# Patient Record
Sex: Female | Born: 1995 | Race: White | Hispanic: No | Marital: Single | Smoking: Never smoker
Health system: Southern US, Community
[De-identification: ages and names within clinical notes are randomized; demographics above are authoritative.]

## PROBLEM LIST (undated history)

## (undated) DIAGNOSIS — E079 Disorder of thyroid, unspecified: Secondary | ICD-10-CM

---

## 2020-02-18 ENCOUNTER — Emergency Department
Admission: EM | Admit: 2020-02-18 | Discharge: 2020-02-18 | Disposition: A | Discharge status: Home / Self Care | Payer: BC Managed Care – PPO | Attending: Emergency Medicine | Admitting: Emergency Medicine

## 2020-02-18 ENCOUNTER — Emergency Department: Payer: BC Managed Care – PPO

## 2020-02-18 ENCOUNTER — Other Ambulatory Visit: Payer: Self-pay

## 2020-02-18 DIAGNOSIS — Z79899 Other long term (current) drug therapy: Secondary | ICD-10-CM | POA: Insufficient documentation

## 2020-02-18 DIAGNOSIS — E079 Disorder of thyroid, unspecified: Secondary | ICD-10-CM | POA: Diagnosis not present

## 2020-02-18 DIAGNOSIS — M79604 Pain in right leg: Secondary | ICD-10-CM | POA: Insufficient documentation

## 2020-02-18 DIAGNOSIS — R52 Pain, unspecified: Secondary | ICD-10-CM

## 2020-02-18 DIAGNOSIS — M79605 Pain in left leg: Secondary | ICD-10-CM | POA: Diagnosis not present

## 2020-02-18 DIAGNOSIS — E860 Dehydration: Secondary | ICD-10-CM | POA: Diagnosis not present

## 2020-02-18 DIAGNOSIS — M7918 Myalgia, other site: Secondary | ICD-10-CM | POA: Insufficient documentation

## 2020-02-18 DIAGNOSIS — Z20822 Contact with and (suspected) exposure to covid-19: Secondary | ICD-10-CM | POA: Insufficient documentation

## 2020-02-18 HISTORY — DX: Disorder of thyroid, unspecified: E07.9

## 2020-02-18 LAB — URINALYSIS, COMPLETE (UACMP) WITH MICROSCOPIC
Bilirubin Urine: NEGATIVE
Glucose, UA: NEGATIVE mg/dL
Hgb urine dipstick: NEGATIVE
Ketones, ur: NEGATIVE mg/dL
Leukocytes,Ua: NEGATIVE
Nitrite: NEGATIVE
Protein, ur: 30 mg/dL — AB
Specific Gravity, Urine: 1.019 (ref 1.005–1.030)
pH: 9 — ABNORMAL HIGH (ref 5.0–8.0)

## 2020-02-18 LAB — CBC WITH DIFFERENTIAL/PLATELET
Abs Immature Granulocytes: 0.01 10*3/uL (ref 0.00–0.07)
Basophils Absolute: 0 10*3/uL (ref 0.0–0.1)
Basophils Relative: 0 %
Eosinophils Absolute: 0 10*3/uL (ref 0.0–0.5)
Eosinophils Relative: 0 %
HCT: 44.2 % (ref 36.0–46.0)
Hemoglobin: 15.6 g/dL — ABNORMAL HIGH (ref 12.0–15.0)
Immature Granulocytes: 0 %
Lymphocytes Relative: 7 %
Lymphs Abs: 0.2 10*3/uL — ABNORMAL LOW (ref 0.7–4.0)
MCH: 31 pg (ref 26.0–34.0)
MCHC: 35.3 g/dL (ref 30.0–36.0)
MCV: 87.7 fL (ref 80.0–100.0)
Monocytes Absolute: 0.6 10*3/uL (ref 0.1–1.0)
Monocytes Relative: 18 %
Neutro Abs: 2.4 10*3/uL (ref 1.7–7.7)
Neutrophils Relative %: 75 %
Platelets: 133 10*3/uL — ABNORMAL LOW (ref 150–400)
RBC: 5.04 MIL/uL (ref 3.87–5.11)
RDW: 11.6 % (ref 11.5–15.5)
WBC: 3.2 10*3/uL — ABNORMAL LOW (ref 4.0–10.5)
nRBC: 0 % (ref 0.0–0.2)

## 2020-02-18 LAB — COMPREHENSIVE METABOLIC PANEL
ALT: 15 U/L (ref 0–44)
AST: 26 U/L (ref 15–41)
Albumin: 4.7 g/dL (ref 3.5–5.0)
Alkaline Phosphatase: 52 U/L (ref 38–126)
Anion gap: 11 (ref 5–15)
BUN: 11 mg/dL (ref 6–20)
CO2: 25 mmol/L (ref 22–32)
Calcium: 10.1 mg/dL (ref 8.9–10.3)
Chloride: 101 mmol/L (ref 98–111)
Creatinine, Ser: 0.83 mg/dL (ref 0.44–1.00)
GFR calc Af Amer: >60 mL/min (ref >60)
GFR calc non Af Amer: >60 mL/min (ref >60)
Glucose, Bld: 110 mg/dL — ABNORMAL HIGH (ref 70–99)
Potassium: 3.8 mmol/L (ref 3.5–5.1)
Sodium: 137 mmol/L (ref 135–145)
Total Bilirubin: 1.4 mg/dL — ABNORMAL HIGH (ref 0.3–1.2)
Total Protein: 7.2 g/dL (ref 6.5–8.1)

## 2020-02-18 LAB — POC SARS CORONAVIRUS 2 AG: SARS Coronavirus 2 Ag: NEGATIVE

## 2020-02-18 LAB — POCT PREGNANCY, URINE: Preg Test, Ur: NEGATIVE

## 2020-02-18 LAB — PROTIME-INR
INR: 1 (ref 0.8–1.2)
Prothrombin Time: 12.7 seconds (ref 11.4–15.2)

## 2020-02-18 LAB — CK: Total CK: 72 U/L (ref 38–234)

## 2020-02-18 LAB — INFLUENZA PANEL BY PCR (TYPE A & B)
Influenza A By PCR: NEGATIVE
Influenza B By PCR: NEGATIVE

## 2020-02-18 LAB — TSH: TSH: 1.986 u[IU]/mL (ref 0.350–4.500)

## 2020-02-18 LAB — LACTIC ACID, PLASMA
Lactic Acid, Venous: 3.1 mmol/L (ref 0.5–1.9)
Lactic Acid, Venous: 1.5 mmol/L (ref 0.5–1.9)

## 2020-02-18 LAB — SARS CORONAVIRUS 2 BY RT PCR (HOSPITAL ORDER, PERFORMED IN ~~LOC~~ HOSPITAL LAB): SARS Coronavirus 2: NEGATIVE

## 2020-02-18 LAB — T4, FREE: Free T4: 1.29 ng/dL — ABNORMAL HIGH (ref 0.61–1.12)

## 2020-02-18 MED ORDER — ONDANSETRON HCL 4 MG PO TABS: 4.0000 mg | ORAL_TABLET | Freq: Every day | ORAL | 0 refills | Status: AC | PRN

## 2020-02-18 MED ORDER — LACTATED RINGERS IV BOLUS
1000.0000 mL | Freq: Once | INTRAVENOUS | Status: AC
  Administered 2020-02-18: 1000 mL via INTRAVENOUS

## 2020-02-18 MED ORDER — HYDROCODONE-ACETAMINOPHEN 5-325 MG PO TABS
1.0000 | ORAL_TABLET | Freq: Once | ORAL | Status: AC
  Administered 2020-02-18: 1 via ORAL
  Filled 2020-02-18: qty 1

## 2020-02-18 MED ORDER — DOXYCYCLINE HYCLATE 100 MG PO TABS
100.0000 mg | ORAL_TABLET | Freq: Once | ORAL | Status: AC
  Administered 2020-02-18: 100 mg via ORAL
  Filled 2020-02-18: qty 1

## 2020-02-18 MED ORDER — ACETAMINOPHEN 325 MG PO TABS
650.0000 mg | ORAL_TABLET | Freq: Once | ORAL | Status: AC
  Administered 2020-02-18: 650 mg via ORAL
  Filled 2020-02-18: qty 2

## 2020-02-18 MED ORDER — DOXYCYCLINE HYCLATE 100 MG PO TABS
100.0000 mg | ORAL_TABLET | Freq: Two times a day (BID) | ORAL | 0 refills | Status: AC
Start: 2020-02-18 — End: 2020-02-25

## 2020-02-18 NOTE — ED Notes (Signed)
Pt states that she is already feeling better, half the bag of IV fluids have infused.  Will allow the rest of the IV fluids to infuse before drawing the second LA

## 2020-02-18 NOTE — ED Notes (Addendum)
At 1900 I received a Call from mother who advises that pt has pain again.  Notified her that she can come back to ED.  She states that pt feels like she "can't do that again".  I advised that she may also do a e-visit through [employer].

## 2020-02-18 NOTE — ED Triage Notes (Signed)
Pt c/o waking yesterday with body aches, states today worse and more in the BL LE, pt is figity in triage due to discomfort. Denies N/V/D/ or fever.

## 2020-02-18 NOTE — ED Provider Notes (Signed)
[facility] Emergency Department Provider Note    First MD Initiated Contact with Patient 02/18/20 1119     (approximate)  I have reviewed the triage vital signs and the nursing notes.   HISTORY  Chief Complaint Generalized Body Aches    HPI MONET NORTH is a 24 y.o. female with a history of Hashimoto's presents to the ER for evaluation of muscle aches primarily in the bilateral lower extremities and generalized malaise for the past 24 hours.  She works as a Advertising account executive and has been around many sick contacts.  Has not received her Covid vaccination.  [place] does not [place] pulling off any ticks but is frequently shooting film in fields and mother states that she recently had several ticks on her.  Did not actually have any measured fevers.  No headache congestion cough or nausea or vomiting.  No dysuria.  No abdominal pain.  No rashes.    Past Medical History:  Diagnosis Date  . Thyroid disease    No family history on file. History reviewed. No pertinent surgical history. There are no problems to display for this patient.     Prior to Admission medications   Medication Sig Start Date End Date Taking? Authorizing Provider  levothyroxine (SYNTHROID) 50 MCG tablet Take 50 mcg by mouth daily. 01/15/20  Yes [provider]  sertraline (ZOLOFT) 100 MG tablet Take 100 mg by mouth daily. 12/31/19  Yes [provider]  doxycycline (VIBRA-TABS) 100 MG tablet Take 1 tablet (100 mg total) by mouth 2 (two) times daily for 7 days. 02/18/20 02/25/20  Willy Eddy, MD  ondansetron (ZOFRAN) 4 MG tablet Take 1 tablet (4 mg total) by mouth daily as needed. 02/18/20 02/17/21  Willy Eddy, MD    Allergies Bevelyn Buckles (malabar nut tree) [justicia adhatoda], Aspirin, Ibuprofen, and Latex    Social History Social History   Tobacco Use  . Smoking status: Never Smoker  . Smokeless tobacco: Never Used   Substance Use Topics  . Alcohol use: Not Currently  . Drug use: Not Currently    Review of Systems Patient denies headaches, rhinorrhea, blurry vision, numbness, shortness of breath, chest pain, edema, cough, abdominal pain, nausea, vomiting, diarrhea, dysuria, fevers, rashes or hallucinations unless otherwise stated above in HPI. ____________________________________________   PHYSICAL EXAM:  VITAL SIGNS: Vitals:   02/18/20 1300 02/18/20 1406  BP: 108/69 109/68  Pulse: 91 88  Resp:    Temp:    SpO2: 98% 100%    Constitutional: Alert and oriented.  Eyes: Conjunctivae are normal.  Head: Atraumatic. Nose: No congestion/rhinnorhea. Mouth/Throat: Mucous membranes are moist.   Neck: No stridor. Painless ROM.  Cardiovascular: Normal rate, regular rhythm. Grossly normal heart sounds.  Good peripheral circulation. Respiratory: Normal respiratory effort.  No retractions. Lungs CTAB. Gastrointestinal: Soft and nontender. No distention. No abdominal bruits. No CVA tenderness. Genitourinary:  Musculoskeletal: No lower extremity tenderness nor edema.  No joint effusions. Neurologic:  Normal speech and language. No gross focal neurologic deficits are appreciated. No facial droop Skin:  Skin is warm, dry and intact. No rash noted. Psychiatric: Mood and affect are normal. Speech and behavior are normal.  ____________________________________________   LABS (all labs ordered are listed, but only abnormal results are displayed)  Results for orders placed or performed during the hospital encounter of 02/18/20 (from the past 24 hour(s))  Comprehensive metabolic panel     Status: Abnormal   Collection Time: 02/18/20 10:45  AM  Result Value Ref Range   Sodium 137 135 - 145 mmol/L   Potassium 3.8 3.5 - 5.1 mmol/L   Chloride 101 98 - 111 mmol/L   CO2 25 22 - 32 mmol/L   Glucose, Bld 110 (H) 70 - 99 mg/dL   BUN 11 6 - 20 mg/dL   Creatinine, Ser 9.62 0.44 - 1.00 mg/dL   Calcium 83.6 8.9 -  62.9 mg/dL   Total Protein 7.2 6.5 - 8.1 g/dL   Albumin 4.7 3.5 - 5.0 g/dL   AST 26 15 - 41 U/L   ALT 15 0 - 44 U/L   Alkaline Phosphatase 52 38 - 126 U/L   Total Bilirubin 1.4 (H) 0.3 - 1.2 mg/dL   GFR calc non Af Amer >60 >60 mL/min   GFR calc Af Amer >60 >60 mL/min   Anion gap 11 5 - 15  Lactic acid, plasma     Status: Abnormal   Collection Time: 02/18/20 10:45 AM  Result Value Ref Range   Lactic Acid, Venous 3.1 (HH) 0.5 - 1.9 mmol/L  CBC with Differential     Status: Abnormal   Collection Time: 02/18/20 10:45 AM  Result Value Ref Range   WBC 3.2 (L) 4.0 - 10.5 K/uL   RBC 5.04 3.87 - 5.11 MIL/uL   Hemoglobin 15.6 (H) 12.0 - 15.0 g/dL   HCT 47.6 36 - 46 %   MCV 87.7 80.0 - 100.0 fL   MCH 31.0 26.0 - 34.0 pg   MCHC 35.3 30.0 - 36.0 g/dL   RDW 54.6 50.3 - 54.6 %   Platelets 133 (L) 150 - 400 K/uL   nRBC 0.0 0.0 - 0.2 %   Neutrophils Relative % 75 %   Neutro Abs 2.4 1.7 - 7.7 K/uL   Lymphocytes Relative 7 %   Lymphs Abs 0.2 (L) 0.7 - 4.0 K/uL   Monocytes Relative 18 %   Monocytes Absolute 0.6 0 - 1 K/uL   Eosinophils Relative 0 %   Eosinophils Absolute 0.0 0 - 0 K/uL   Basophils Relative 0 %   Basophils Absolute 0.0 0 - 0 K/uL   Immature Granulocytes 0 %   Abs Immature Granulocytes 0.01 0.00 - 0.07 K/uL  Protime-INR     Status: None   Collection Time: 02/18/20 10:45 AM  Result Value Ref Range   Prothrombin Time 12.7 11.4 - 15.2 seconds   INR 1.0 0.8 - 1.2  Urinalysis, Complete w Microscopic     Status: Abnormal   Collection Time: 02/18/20 10:45 AM  Result Value Ref Range   Color, Urine YELLOW (A) YELLOW   APPearance HAZY (A) CLEAR   Specific Gravity, Urine 1.019 1.005 - 1.030   pH 9.0 (H) 5.0 - 8.0   Glucose, UA NEGATIVE NEGATIVE mg/dL   Hgb urine dipstick NEGATIVE NEGATIVE   Bilirubin Urine NEGATIVE NEGATIVE   Ketones, ur NEGATIVE NEGATIVE mg/dL   Protein, ur 30 (A) NEGATIVE mg/dL   Nitrite NEGATIVE NEGATIVE   Leukocytes,Ua NEGATIVE NEGATIVE   RBC / HPF 0-5  0 - 5 RBC/hpf   WBC, UA 0-5 0 - 5 WBC/hpf   Bacteria, UA FEW (A) NONE SEEN   Squamous Epithelial / LPF 0-5 0 - 5   Mucus PRESENT   TSH     Status: None   Collection Time: 02/18/20 10:45 AM  Result Value Ref Range   TSH 1.986 0.350 - 4.500 uIU/mL  T4, free     Status: Abnormal  Collection Time: 02/18/20 10:45 AM  Result Value Ref Range   Free T4 1.29 (H) 0.61 - 1.12 ng/dL  CK     Status: None   Collection Time: 02/18/20 11:28 AM  Result Value Ref Range   Total CK 72 38.0 - 234.0 U/L  Pregnancy, urine POC     Status: None   Collection Time: 02/18/20 11:31 AM  Result Value Ref Range   Preg Test, Ur NEGATIVE NEGATIVE  SARS Coronavirus 2 by RT PCR (hospital order, performed in [facility] hospital lab) Nasopharyngeal Nasopharyngeal Swab     Status: None   Collection Time: 02/18/20 11:41 AM   Specimen: Nasopharyngeal Swab  Result Value Ref Range   SARS Coronavirus 2 NEGATIVE NEGATIVE  Influenza panel by PCR (type A & B)     Status: None   Collection Time: 02/18/20 11:41 AM  Result Value Ref Range   Influenza A By PCR NEGATIVE NEGATIVE   Influenza B By PCR NEGATIVE NEGATIVE  Lactic acid, plasma     Status: None   Collection Time: 02/18/20 12:40 PM  Result Value Ref Range   Lactic Acid, Venous 1.5 0.5 - 1.9 mmol/L  POC SARS Coronavirus 2 Ag     Status: None   Collection Time: 02/18/20 12:51 PM  Result Value Ref Range   SARS Coronavirus 2 Ag NEGATIVE NEGATIVE   ____________________________________________  EKG_________________________  RADIOLOGY  I personally reviewed all radiographic images ordered to evaluate for the above acute complaints and reviewed radiology reports and findings.  These findings were personally discussed with the patient.  Please see medical record for radiology report.  ____________________________________________   PROCEDURES  Procedure(s) performed:  Procedures    Critical Care performed:  no ____________________________________________   INITIAL IMPRESSION / ASSESSMENT AND PLAN / ED COURSE  Pertinent labs & imaging results that were available during my care of the patient were reviewed by me and considered in my medical decision making (see chart for details).   DDX: pna, uti, pyelo, covid, tbi, rmsf  CHERYN LUNDQUIST is a 24 y.o. who presents to the ED with symptoms as described above.  Complaining of flulike illness.  Will order IV fluids sure triage bladder does show evidence of elevated lactate.  Does have lymphopenia mild thrombocytopenia otherwise appears clinically dehydrated.  Will add on Covid.  Her abdominal exam is soft and benign.  No meningismus.  No URI symptoms.  Denies any dysuria or flank pain.  No numbness or tingling.  Will order CK to evaluate for any evidence of myositis.  Mother is currently being treated for tickborne illness and is currently on doxycycline.  Certainly has been exposed but no clear rashes or obvious tick bites.  Clinical Course as of Feb 18 1608  Tue Feb 18, 2020  1418 Patient reassessed.  Feels much improved.  And family is requesting influenza testing which is reasonable.  Fortunately her Covid PCR is negative.  Will cover with doxycycline.  Her repeat abdominal exam is benign she not complaining sore throat no chest pain.  No hypoxia.  On exam and work-up no indication for CT imaging at this time.   [PR]  1552 Have added on venous ultrasound to ensure that the bilateral leg pain is not related to venoocclusive disease.  She has good peripheral pulses.  Again she is well-appearing no sirs criteria at this time.  She is tolerating p.o.  Will cover with Doxy but if ultrasound is negative anticipate she will be stable for discharge  home for outpatient management.  Discussed signs symptoms which she should return to the ER.   [PR]    Clinical Course User Index [PR] Merlyn Lot, MD    The patient was evaluated in Emergency Department  today for the symptoms described in the history of present illness. He/she was evaluated in the context of the global COVID-19 pandemic, which necessitated consideration that the patient might be at risk for infection with the SARS-CoV-2 virus that causes COVID-19. Institutional protocols and algorithms that pertain to the evaluation of patients at risk for COVID-19 are in a state of rapid change based on information released by regulatory bodies including the CDC and federal and state organizations. These policies and algorithms were followed during the patient's care in the ED.  As part of my medical decision making, I reviewed the following data within the [facility] notes reviewed and incorporated, Labs reviewed, notes from prior ED visits and [place] Controlled Substance Database   ____________________________________________   FINAL CLINICAL IMPRESSION(S) / ED DIAGNOSES  Final diagnoses:  Body aches  Dehydration      NEW MEDICATIONS STARTED DURING THIS VISIT:  New Prescriptions   DOXYCYCLINE (VIBRA-TABS) 100 MG TABLET    Take 1 tablet (100 mg total) by mouth 2 (two) times daily for 7 days.   ONDANSETRON (ZOFRAN) 4 MG TABLET    Take 1 tablet (4 mg total) by mouth daily as needed.     Note:  This document was prepared using Dragon voice recognition software and may include unintentional dictation errors.    Merlyn Lot, MD 02/18/20 [phone]

## 2020-02-20 LAB — URINE CULTURE: Culture: NO GROWTH

## 2020-02-23 LAB — CULTURE, BLOOD (ROUTINE X 2)
Culture: NO GROWTH
Culture: NO GROWTH
Special Requests: ADEQUATE

## 2021-03-05 IMAGING — CR DG CHEST 2V
1 series · 2 of 2 positions shown · non-contrast
Comparison: No prior.

CLINICAL DATA: Suspected sepsis.

EXAM:
CHEST - 2 VIEW

[Series 1: dg chest 2 view · 0.14mm/px · 2 of 2 slices shown]
[im 1/2]
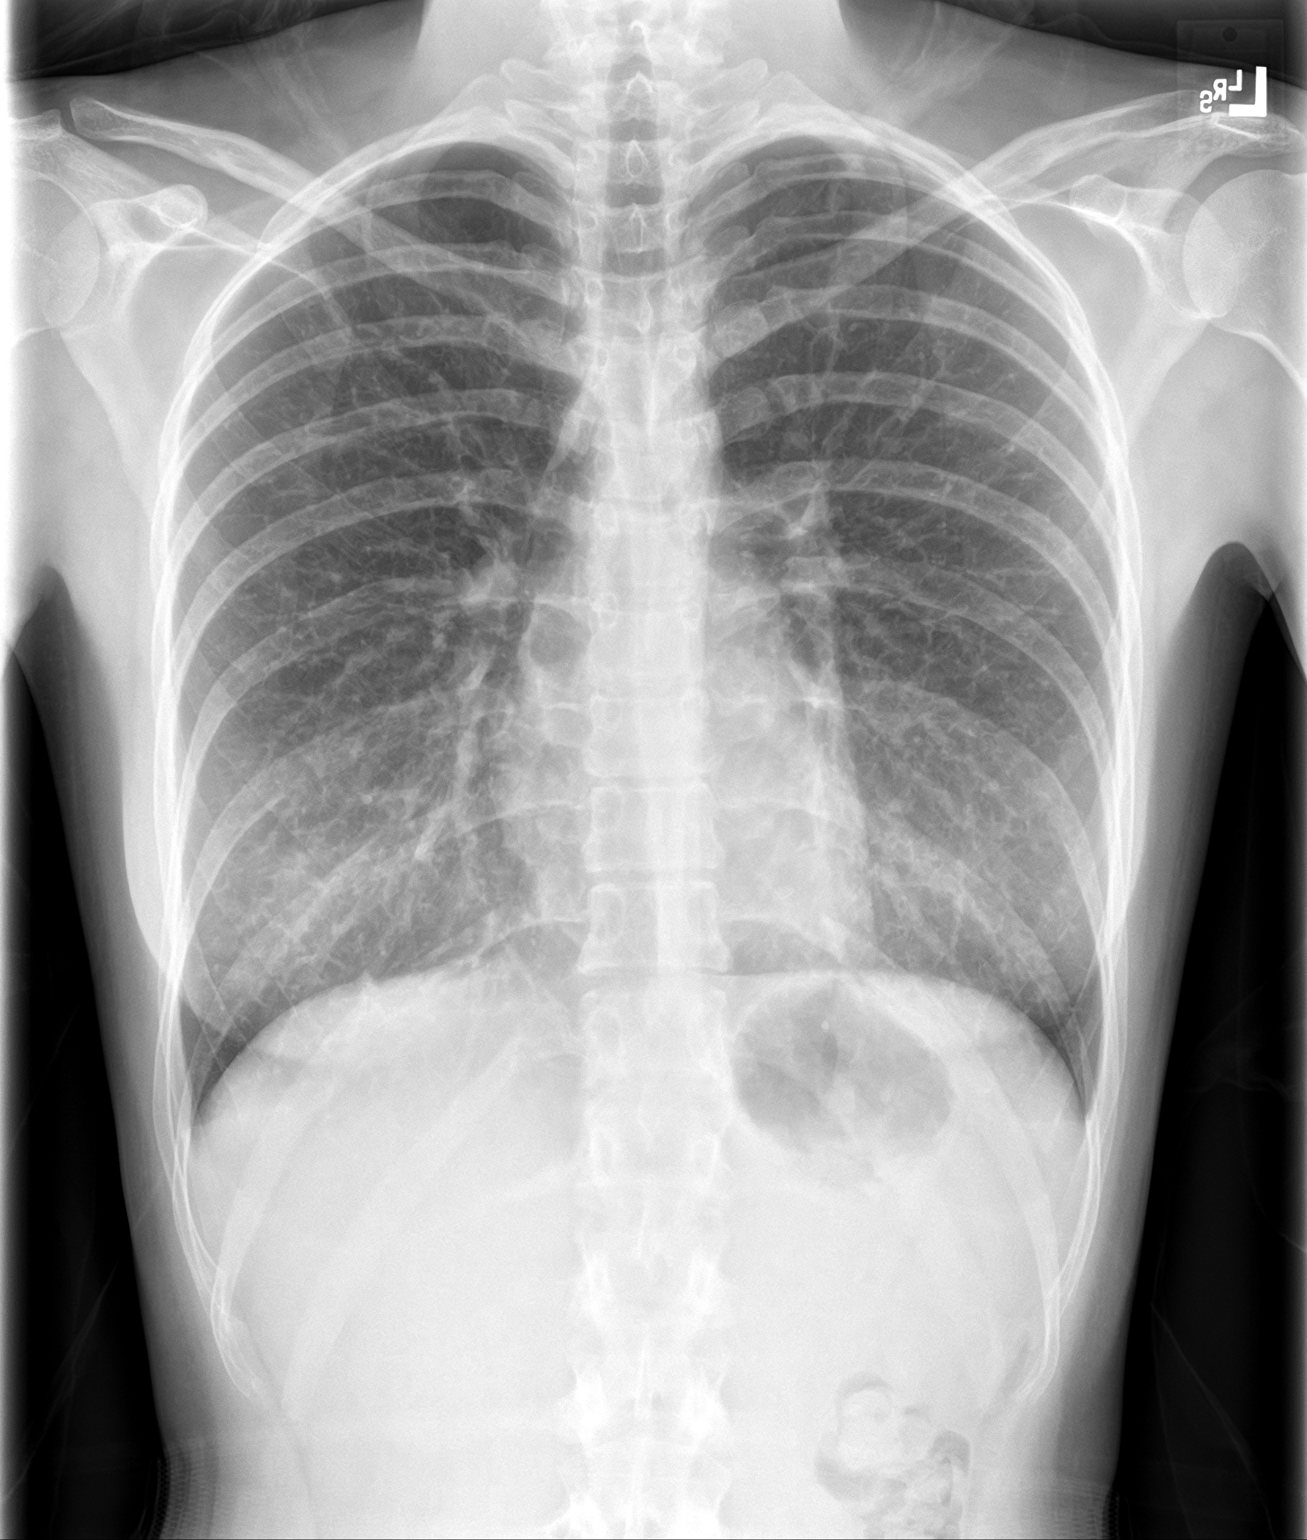
[im 2/2]
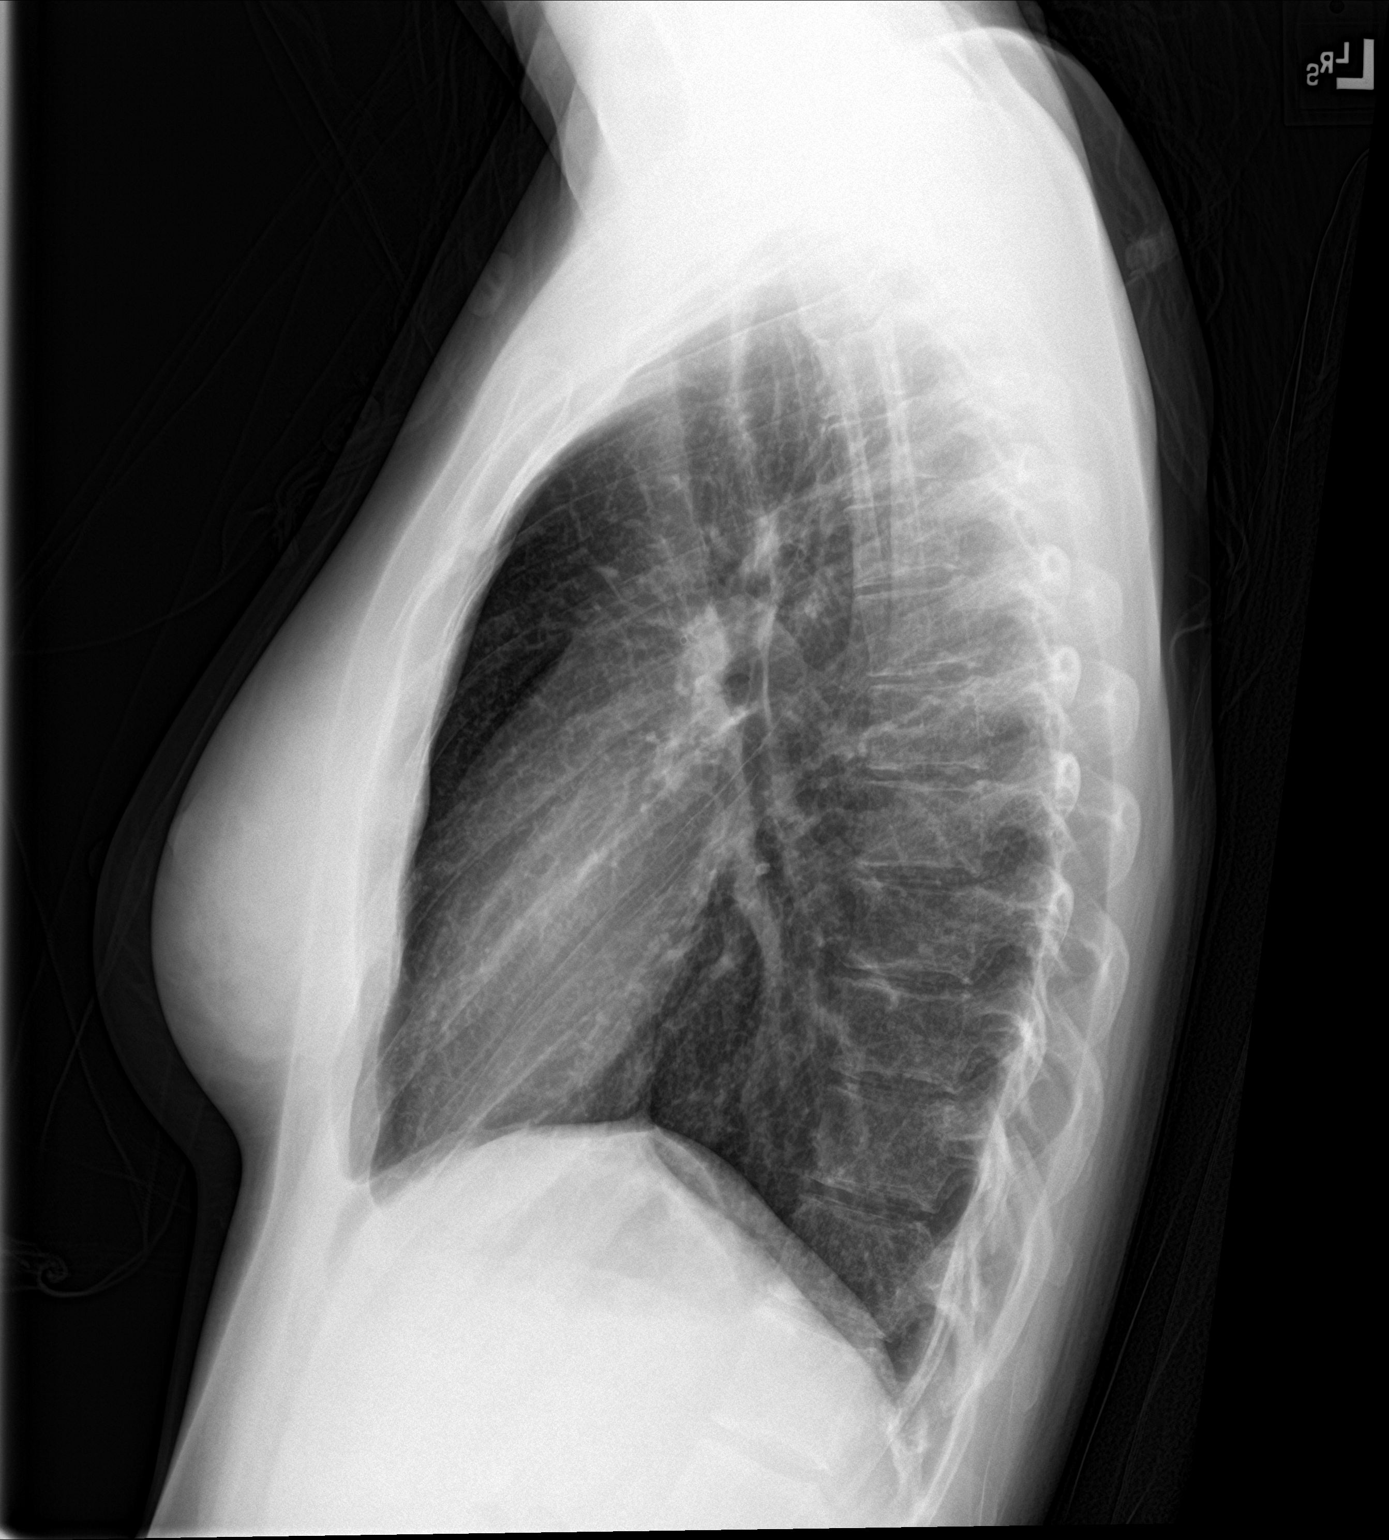

[2 of 2 positions shown; findings below may reference images not displayed]

FINDINGS: Mediastinum and hilar structures normal. Heart size normal. Mild
bilateral interstitial prominence. No pleural effusion or
pneumothorax. No acute bony abnormality.
IMPRESSION: Mild bilateral interstitial prominence. Pneumonitis could present in
this fashion.

## 2021-03-05 IMAGING — US US EXTREM LOW VENOUS
1 series · 13 of 24 positions shown · non-contrast
Comparison: None.

CLINICAL DATA: Bilateral lower extremity pain.



[Series 1: us venous img lower bilat (dvt) · portal-venous · 13 of 73 slices shown]
[im 1/73]
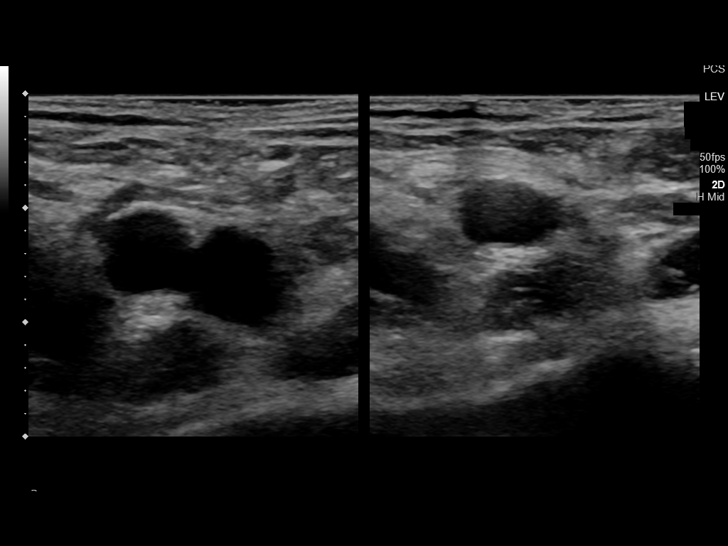
[im 7/73]
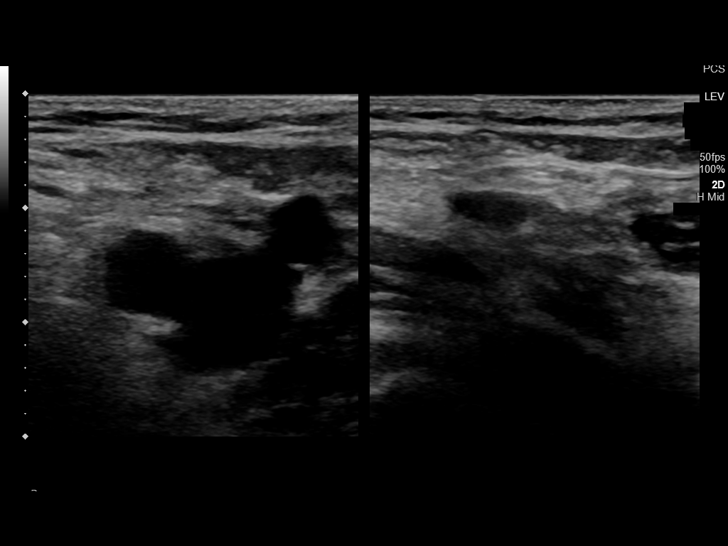
[im 13/73]
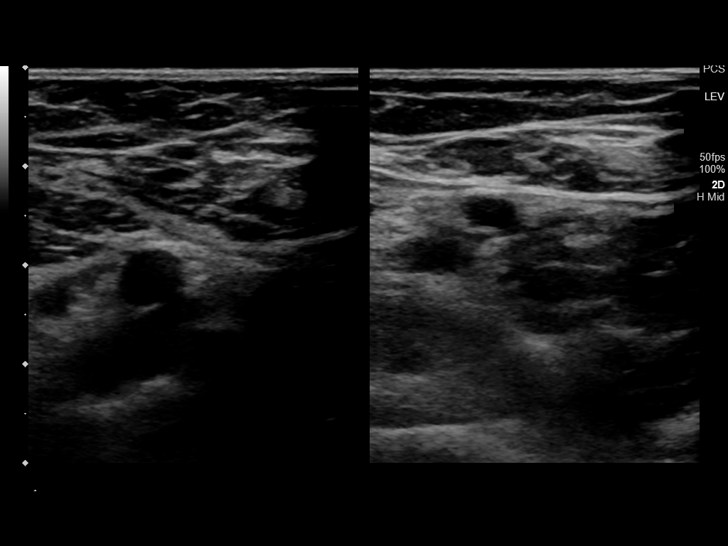
[im 19/73]
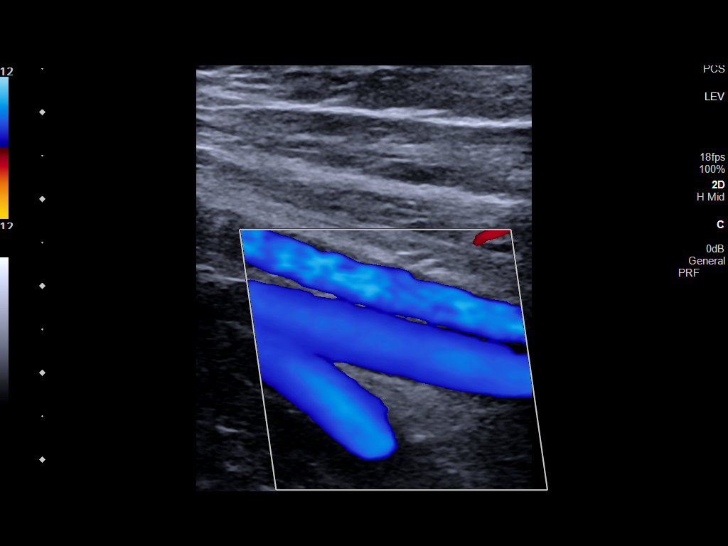
[im 26/73]
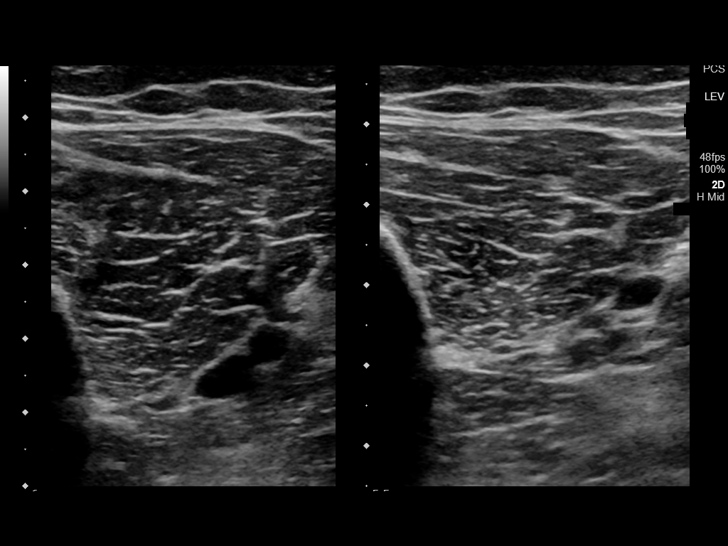
[im 32/73]
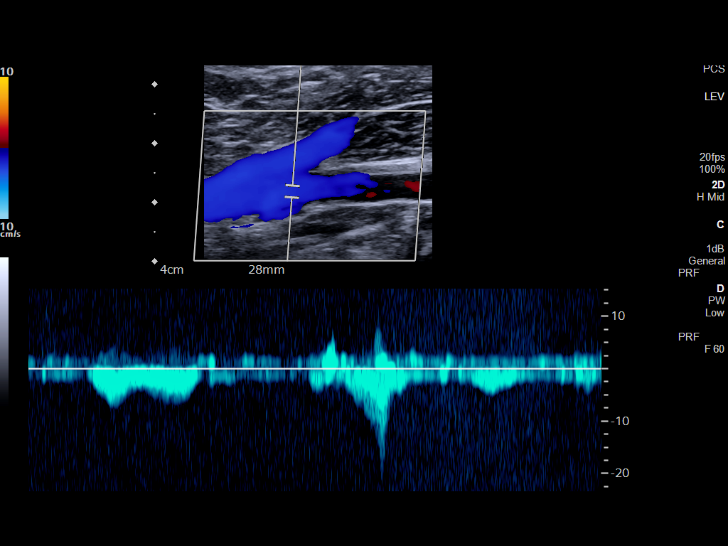
[im 38/73]
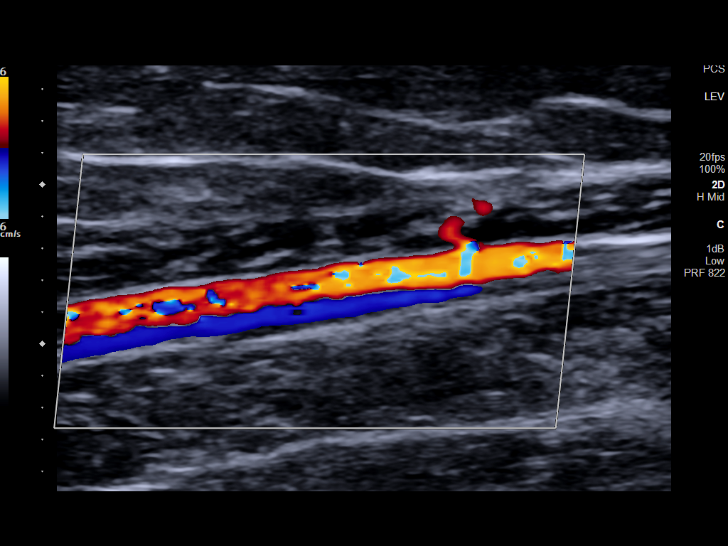
[im 41/73]
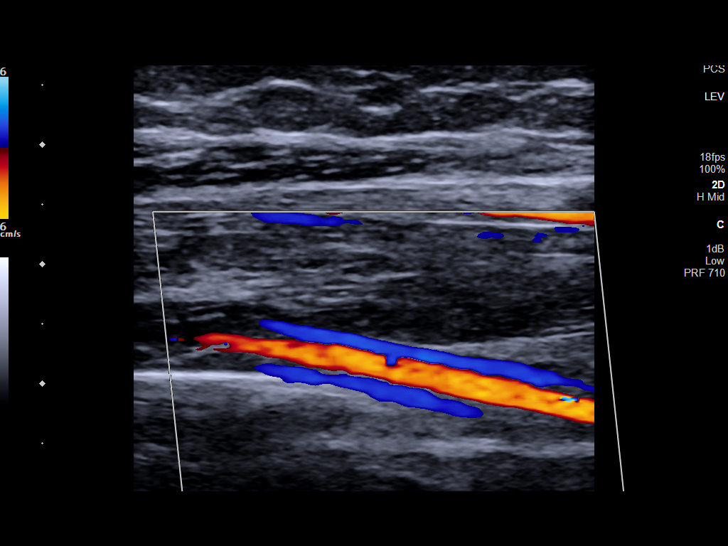
[im 47/73]
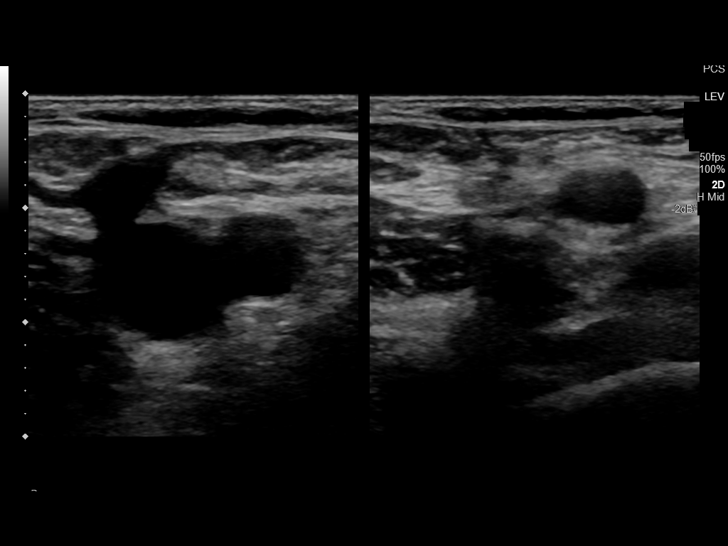
[im 54/73]
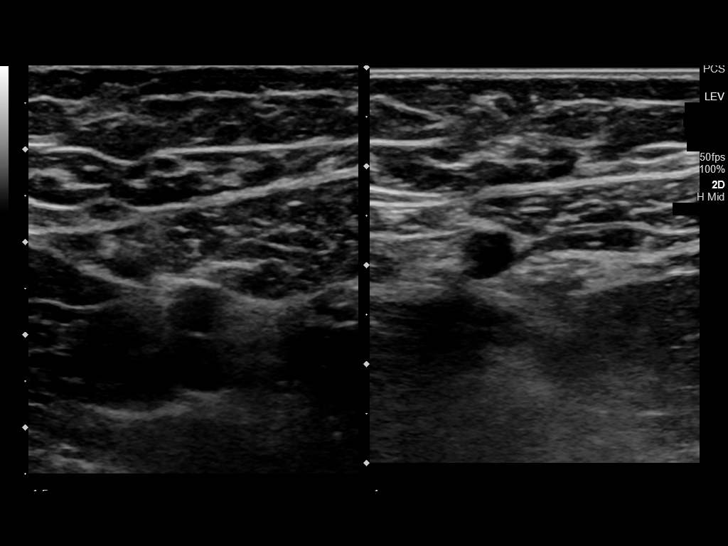
[im 60/73]
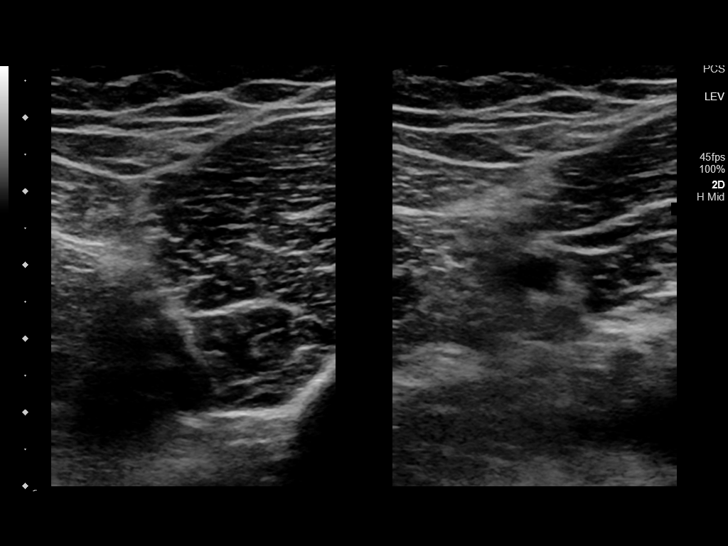
[im 66/73]
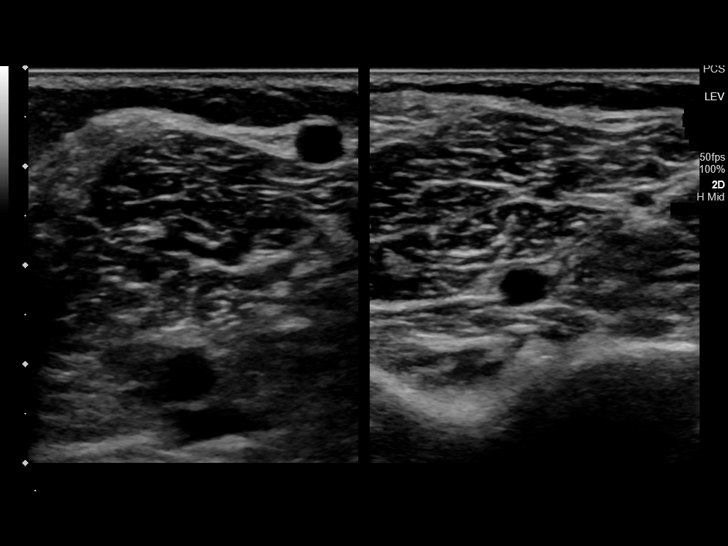
[im 73/73]
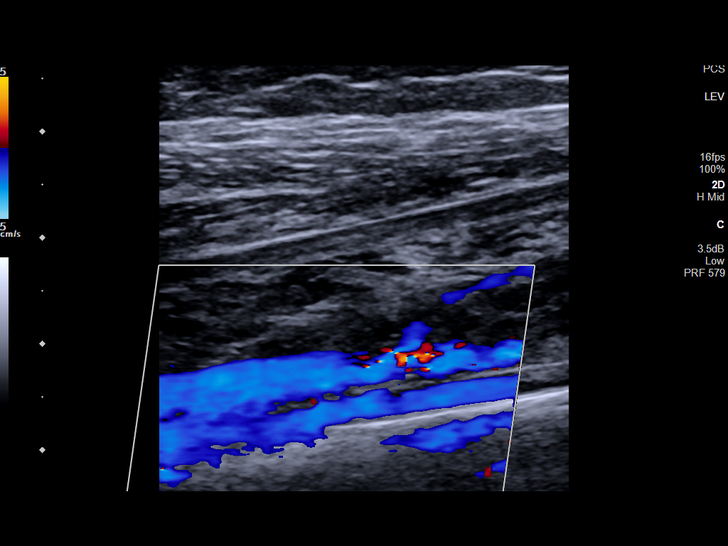

[13 of 24 positions shown; findings below may reference images not displayed]

FINDINGS: RIGHT LOWER EXTREMITY

Common Femoral Vein: No evidence of thrombus. Normal
compressibility, respiratory phasicity and response to augmentation.

Saphenofemoral Junction: No evidence of thrombus. Normal
compressibility and flow on color Doppler imaging.

Profunda Femoral Vein: No evidence of thrombus. Normal
compressibility and flow on color Doppler imaging.

Femoral Vein: No evidence of thrombus. Normal compressibility,
respiratory phasicity and response to augmentation.

Popliteal Vein: No evidence of thrombus. Normal compressibility,
respiratory phasicity and response to augmentation.

Calf Veins: No evidence of thrombus. Normal compressibility and flow
on color Doppler imaging.

Superficial Great Saphenous Vein: No evidence of thrombus. Normal
compressibility.

Venous Reflux:  None.

Other Findings: No evidence of superficial thrombophlebitis or
abnormal fluid collection.

LEFT LOWER EXTREMITY

Common Femoral Vein: No evidence of thrombus. Normal
compressibility, respiratory phasicity and response to augmentation.

Saphenofemoral Junction: No evidence of thrombus. Normal
compressibility and flow on color Doppler imaging.

Profunda Femoral Vein: No evidence of thrombus. Normal
compressibility and flow on color Doppler imaging.

Femoral Vein: No evidence of thrombus. Normal compressibility,
respiratory phasicity and response to augmentation.

Popliteal Vein: No evidence of thrombus. Normal compressibility,
respiratory phasicity and response to augmentation.

Calf Veins: No evidence of thrombus. Normal compressibility and flow
on color Doppler imaging.

Superficial Great Saphenous Vein: No evidence of thrombus. Normal
compressibility.

Venous Reflux:  None.

Other Findings: No evidence of superficial thrombophlebitis or
abnormal fluid collection.
IMPRESSION: No evidence of deep venous thrombosis in either lower extremity.
